# Patient Record
Sex: Male | Born: 1975 | Hispanic: No | Marital: Married | Smoking: Current every day smoker
Health system: Southern US, Community
[De-identification: ages and names within clinical notes are randomized; demographics above are authoritative.]

## PROBLEM LIST (undated history)

## (undated) DIAGNOSIS — A048 Other specified bacterial intestinal infections: Secondary | ICD-10-CM

---

## 2016-05-06 ENCOUNTER — Ambulatory Visit
Admission: RE | Admit: 2016-05-06 | Discharge: 2016-05-06 | Disposition: A | Discharge status: Home / Self Care | Payer: PPO | Source: Ambulatory Visit | Attending: General Surgery | Admitting: General Surgery

## 2016-05-06 ENCOUNTER — Other Ambulatory Visit: Payer: Self-pay | Admitting: General Surgery

## 2016-05-06 DIAGNOSIS — M25562 Pain in left knee: Secondary | ICD-10-CM

## 2016-06-14 ENCOUNTER — Encounter: Payer: PPO | Attending: General Surgery | Admitting: Dietician

## 2016-06-14 DIAGNOSIS — Z713 Dietary counseling and surveillance: Secondary | ICD-10-CM | POA: Insufficient documentation

## 2016-06-14 NOTE — Progress Notes (Signed)
  Pre-Op Assessment Visit:  Pre-Operative Sleeve gastrectomy Surgery  Medical Nutrition Therapy:  Appt start time: 215   End time:  300.  Patient was seen on 06/14/2016 for Pre-Operative Nutrition Assessment. Assessment and letter of approval [facility] coordinator on 06/15/2016.   Preferred Learning Style:   No preference indicated   Learning Readiness:   Ready  Handouts given during visit include:  Pre-Op Goals Bariatric Surgery Protein Shakes   During the appointment today the following Pre-Op Goals were reviewed with the patient: Maintain or lose weight as instructed by your surgeon Make healthy food choices Begin to limit portion sizes Limited concentrated sugars and fried foods Keep fat/sugar in the single digits per serving on   food labels Practice CHEWING your food  (aim for 30 chews per bite or until applesauce consistency) Practice not drinking 15 minutes before, during, and 30 minutes after each meal/snack Avoid all carbonated beverages  Avoid/limit caffeinated beverages  Avoid all sugar-sweetened beverages Consume 3 meals per day; eat every 3-5 hours Make a list of non-food related activities Aim for 64-100 ounces of FLUID daily  Aim for at least 60-80 grams of PROTEIN daily Look for a liquid protein source that contain ?15 g protein and ?5 g carbohydrate  (ex: shakes, drinks, shots)  Demonstrated degree of understanding via:  Teach Back  Teaching Method Utilized:  Visual Auditory Hands on  Barriers to learning/adherence to lifestyle change: none  [facility] to call the [facility] to enroll in Pre-Op and Post-Op Nutrition Education when surgery date is scheduled.

## 2016-07-12 ENCOUNTER — Encounter: Payer: PPO | Attending: General Surgery | Admitting: Registered"

## 2016-07-12 DIAGNOSIS — Z713 Dietary counseling and surveillance: Secondary | ICD-10-CM | POA: Insufficient documentation

## 2016-07-12 DIAGNOSIS — E6609 Other obesity due to excess calories: Secondary | ICD-10-CM

## 2016-07-19 NOTE — Progress Notes (Signed)
Supervised Weight Loss Class:  Appt start time: 1600   End time:  1630.  Patient was seen on 07/12/2016 for the "Mindful Eating" Supervised Weight Loss Class at the [facility]. The following learning objectives were met by the patient during this class:  Surgery type: unkwn Start weight at [facility]: 313.3 lbs on 06/14/16 Weight today: 312.5 lbs Weight change from start: 0.8 weight loss  Objectives:  Discuss masticating foods to appropriate consistency  Helps food fit in pouch and prevents pain and vomiting  Eating slowly and taking tiny bites help brain register fullness  Encourage patients to begin to think about what diet will be like after surgery  Encourage patients to begin to consider portion sizes and listen to hunger/fullness cues  Review importance of regular meals and snacks  Goals:  Begin limiting fried and other high fat foods and foods high in sugar  Eat proteins first, then vegetables, then starch  Practice chewing foods to applesauce consistency  Aim to spend 20 minutes eating meals  Work on eating 3 meals a day (or every 3-5 hours) if you are not already doing so  Handouts given:  Mindful Eating brochure

## 2016-08-05 ENCOUNTER — Encounter: Payer: PPO | Attending: General Surgery | Admitting: Registered"

## 2016-08-05 DIAGNOSIS — Z713 Dietary counseling and surveillance: Secondary | ICD-10-CM | POA: Diagnosis present

## 2016-08-06 NOTE — Progress Notes (Signed)
Supervised Weight Loss Class:  Appt start time: 1400   End time:  1430  SWL visit #2 Patient was seen on 08/05/2016 for the "Exercise" Supervised Weight Loss Class at [facility].   Surgery type: sleeve gastrectomy Start weight at [facility]: 313.3 lb Weight today: 308.8 lb Weight change since start: 4.5 lb wt loss Note for next SWL visit: get accurate height measurement (without shoes) for accurate BMI  The following learning objectives were met by the patient during this class:  Objectives:  Discuss importance of exercise  Helps burn calories  Preserves/builds lean mass  Stress relief  Weight maintenance  Explain BELT program details  Encouraged to engage in moderate physical activity including cardiovascular and weight bearing weekly  Goals: Find an exercise/activity that you enjoy Follow physical activity recommendations stated Follow diet recommendations listed below   Estimated energy needs: 1600-1800 calories  Handouts given: Exercise Brochure

## 2016-09-09 ENCOUNTER — Encounter: Payer: PPO | Attending: General Surgery | Admitting: Registered"

## 2016-09-09 DIAGNOSIS — E669 Obesity, unspecified: Secondary | ICD-10-CM

## 2016-09-09 DIAGNOSIS — Z713 Dietary counseling and surveillance: Secondary | ICD-10-CM | POA: Diagnosis present

## 2016-09-10 NOTE — Progress Notes (Signed)
Supervised Weight Loss Class:  Appt start time: 1600   End time:  1630  Patient was seen on 09/09/2016 for the "Vitamins" Supervised Weight Loss Class at the [facility].   Surgery type: Sleeve gastrectomy Start weight at [facility]: 313.3 Weight today: 313.4 Weight change: 0.1 lb gain  The following learning objectives were met by the patient during this class:  Objectives: -Review vitamin and Calcium recommendations based on procedure -Identify appropriate types/brands of vitamins and Calcium -Reinforce the need for chewable or liquid supplements  Goals: -Find appropriate supplements that meet taste and budget needs -Begin taking a recommended multivitamin and Calcium supplement -Follow physical activity recommendations stated -Follow diet recommendations listed below   Energy and Macronutrient Recomendations: Calories: 1800 Carbohydrate: 200 Protein: 135 Fat: 50  Handouts given: Vitamins Brochure

## 2016-10-05 ENCOUNTER — Ambulatory Visit: Payer: PPO | Admitting: Registered"

## 2016-10-06 ENCOUNTER — Encounter: Payer: PPO | Attending: General Surgery | Admitting: Registered"

## 2016-10-06 DIAGNOSIS — Z713 Dietary counseling and surveillance: Secondary | ICD-10-CM | POA: Diagnosis present

## 2016-10-06 DIAGNOSIS — E669 Obesity, unspecified: Secondary | ICD-10-CM

## 2016-10-06 NOTE — Patient Instructions (Signed)
-   Aim to consume 3 meals a day.  - Find a a variety of liquid protein shake options that you enjoy with 15g or more of protein and 5g or less of carbohydrates.

## 2016-10-06 NOTE — Progress Notes (Signed)
Appt start time: 12:07 end time: 12:25 Assessment: 4th SWL Appointment   Start Wt at [facility]: 313.2 Wt: 313.6 BMI: 41.12   Pt arrives having maintained weight from previous visit. Pt is reducing soda intake and not drinking them every day. Pt states he drinks them every other day. Pt states he will be observing Ramadan starting tomorrow.   Preferred Learning Style:   No preference indicated   Learning Readiness:   Ready  Change in progress  MEDICATIONS: See list   DIETARY INTAKE:  24-hr recall:  B ( AM): bagel w/ cheese, honey Snk ( AM): granola bar  L ( PM): banana Snk ( PM): none D ( PM): rice, chicken or spaghetti or pizza Snk ( PM): apple, orange Beverages: water, diet coke, buttermilk  Usual physical activity: Gym 2-3 times per week, walking 30 min daily  Diet to Follow: 1800 calories 200 g carbohydrates 135 g protein 50 g fat   Nutritional Diagnosis:  [place]-3.3 Overweight/obesity related to past poor dietary habits and physical inactivity as evidenced by patient w/ upcoming sleeve gastrectomy surgery following dietary guidelines for continued weight loss.    Intervention:  Nutrition counseling for upcoming Bariatric Surgery.  Goals: - Aim to consume 3 meals a day. - Find a a variety of liquid protein shake options that you enjoy with 15g or more of protein and 5g or less of carbohydrates.  Teaching Method Utilized:  Visual Auditory Hands on  Handouts given during visit include:  Pre-op Goals  Protein Shakes  Barriers to learning/adherence to lifestyle change: none  Demonstrated degree of understanding via:  Teach Back   Monitoring/Evaluation:  Dietary intake, exercise, and body weight in 1 month(s).

## 2016-10-28 ENCOUNTER — Ambulatory Visit: Payer: PPO | Admitting: Registered"

## 2016-12-13 ENCOUNTER — Encounter: Payer: PPO | Attending: General Surgery | Admitting: Registered"

## 2016-12-13 ENCOUNTER — Encounter: Payer: Self-pay | Admitting: Registered"

## 2016-12-13 DIAGNOSIS — Z713 Dietary counseling and surveillance: Secondary | ICD-10-CM | POA: Insufficient documentation

## 2016-12-13 DIAGNOSIS — E669 Obesity, unspecified: Secondary | ICD-10-CM

## 2016-12-13 NOTE — Progress Notes (Signed)
Appt start time: 4:25 end time: 4:50 Assessment: 6th SWL Appointment   Start Wt at [facility]: 313.2 Wt:  309.6 BMI: 40.59   Pt arrives having lost 4 lbs from previous visit. Pt states he just returned from [place] his [place] in [place], where he completed his 5th SWL visit. 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    Preferred Learning Style:   No preference indicated   Learning Readiness:   Ready  Change in progress  MEDICATIONS: See list   DIETARY INTAKE:  24-hr recall:  B ( AM): pancakes Snk ( AM): none L ( PM): chicken  Snk ( PM): none D ( PM): sometimes skips: fruit or rice, chicken or spaghetti or pizza Snk ( PM): none Beverages: coffee, tea, water, diet coke (1-2x/wk), buttermilk  Usual physical activity: Gym 2-3 times per week, walking 30 min daily  Diet to Follow: 1800 calories 200 g carbohydrates 135 g protein 50 g fat   Nutritional Diagnosis:  [place]-3.3 Overweight/obesity related to past poor dietary habits and physical inactivity as evidenced by patient w/ upcoming sleeve gastrectomy surgery following dietary guidelines for continued weight loss.    Intervention:  Nutrition counseling for upcoming Bariatric Surgery.  Goals: - Find a a variety of liquid protein shake options that you enjoy with 15g or more of protein and 5g or less of carbohydrates. - Aim to eat 3 meals/day.   Teaching Method Utilized:  Visual Auditory Hands on  Handouts given during visit include:  Protein Shakes  Barriers to learning/adherence to lifestyle change: none  Demonstrated degree of understanding via:  Teach Back   Monitoring/Evaluation:  Dietary intake, exercise, and body weight prn.

## 2016-12-13 NOTE — Patient Instructions (Addendum)
-   Find a a variety of liquid protein shake options that you enjoy with 15g or more of protein and 5g or less of carbohydrates.  - Aim to eat 3 meals/day.

## 2017-01-03 ENCOUNTER — Other Ambulatory Visit (HOSPITAL_COMMUNITY): Payer: Self-pay | Admitting: General Surgery

## 2017-01-03 DIAGNOSIS — E669 Obesity, unspecified: Secondary | ICD-10-CM

## 2017-01-13 ENCOUNTER — Ambulatory Visit (HOSPITAL_COMMUNITY): Payer: PPO

## 2017-01-13 ENCOUNTER — Encounter (HOSPITAL_COMMUNITY): Payer: Self-pay

## 2017-01-19 ENCOUNTER — Encounter: Payer: PPO | Attending: General Surgery | Admitting: Registered"

## 2017-01-19 ENCOUNTER — Encounter: Payer: Self-pay | Admitting: Registered"

## 2017-01-19 DIAGNOSIS — E669 Obesity, unspecified: Secondary | ICD-10-CM

## 2017-01-19 DIAGNOSIS — Z713 Dietary counseling and surveillance: Secondary | ICD-10-CM | POA: Insufficient documentation

## 2017-01-19 NOTE — Patient Instructions (Addendum)
-   Try Premier Protein shakes.   - Start to wean off caffeine as surgery date approaches.

## 2017-01-19 NOTE — Progress Notes (Signed)
Appt start time: 10:55 end time: 11:05 Assessment: 6th SWL Appointment   Start Wt at [facility]: 313.2 Wt: 304.6 BMI: 39.98   Pt arrives having lost 5 lbs from previous visit. Pt states he has tried Atkins Protein shakes (vanilla). Pt states this is his last SWL visit with us prior to surgery.    Preferred Learning Style:   No preference indicated   Learning Readiness:   Ready  Change in progress  MEDICATIONS: See list   DIETARY INTAKE:  24-hr recall:  B ( AM): pancakes Snk ( AM): none L ( PM): chicken  Snk ( PM): none D ( PM): sometimes skips: fruit or rice, chicken or spaghetti or pizza Snk ( PM): none Beverages: coffee, tea, water, diet coke (1-2x/wk), buttermilk  Usual physical activity: Gym 2-3 times per week, walking 30 min daily  Diet to Follow: 1800 calories 200 g carbohydrates 135 g protein 50 g fat   Nutritional Diagnosis:  [place]-3.3 Overweight/obesity related to past poor dietary habits and physical inactivity as evidenced by patient w/ upcoming sleeve gastrectomy surgery following dietary guidelines for continued weight loss.    Intervention:  Nutrition counseling for upcoming Bariatric Surgery.  Goals: - Try Premier Protein shakes.  - Start to wean off caffeine as surgery date approaches.    Teaching Method Utilized:  Visual Auditory Hands on  Handouts given during visit include:  none  Barriers to learning/adherence to lifestyle change: none  Demonstrated degree of understanding via:  Teach Back   Monitoring/Evaluation:  Dietary intake, exercise, and body weight prn.

## 2017-01-26 ENCOUNTER — Ambulatory Visit (HOSPITAL_COMMUNITY)
Admission: RE | Admit: 2017-01-26 | Discharge: 2017-01-26 | Disposition: A | Discharge status: Home / Self Care | Payer: PPO | Source: Ambulatory Visit | Attending: General Surgery | Admitting: General Surgery

## 2017-01-26 DIAGNOSIS — E669 Obesity, unspecified: Secondary | ICD-10-CM

## 2017-01-26 DIAGNOSIS — Z6830 Body mass index (BMI) 30.0-30.9, adult: Secondary | ICD-10-CM | POA: Diagnosis not present

## 2017-02-21 ENCOUNTER — Ambulatory Visit: Payer: PPO

## 2017-02-28 ENCOUNTER — Encounter: Payer: PPO | Attending: General Surgery | Admitting: Skilled Nursing Facility1

## 2017-02-28 DIAGNOSIS — E669 Obesity, unspecified: Secondary | ICD-10-CM

## 2017-02-28 DIAGNOSIS — Z713 Dietary counseling and surveillance: Secondary | ICD-10-CM | POA: Diagnosis not present

## 2017-03-01 ENCOUNTER — Encounter: Payer: Self-pay | Admitting: Skilled Nursing Facility1

## 2017-03-01 NOTE — Progress Notes (Signed)
Pre-Operative Nutrition Class:  Appt start time: 1102   End time:  1830.  Patient was seen on 02/28/2017 for Pre-Operative Bariatric Surgery Education at the [facility].   Pt states he has been smoking since he was 41 years old so it has been very difficult to stop.  Surgery date:  Surgery type: Sleeve Start weight at [facility]: 313.2 Weight today: 311.5  Samples given per MNT protocol. Patient educated on appropriate usage: -Try kefir in the yogurt aisle   -Try to add in vegetables throughout the day  The following the learning objectives were met by the patient during this course:  Identify Pre-Op Dietary Goals and will begin 2 weeks pre-operatively  Identify appropriate sources of fluids and proteins   State protein recommendations and appropriate sources pre and post-operatively  Identify Post-Operative Dietary Goals and will follow for 2 weeks post-operatively  Identify appropriate multivitamin and calcium sources  Describe the need for physical activity post-operatively and will follow MD recommendations  State when to call healthcare provider regarding medication questions or post-operative complications  Handouts given during class include:  Pre-Op Bariatric Surgery Diet Handout  Protein Shake Handout  Post-Op Bariatric Surgery Nutrition Handout  BELT Program Information Flyer  Support Group Information Flyer  [facility] Bariatric Supplements Price List  Follow-Up Plan: Patient will follow-up at [facility] 2 weeks post operatively for diet advancement per MD.

## 2017-07-28 ENCOUNTER — Other Ambulatory Visit: Payer: Self-pay

## 2017-07-28 ENCOUNTER — Emergency Department (HOSPITAL_BASED_OUTPATIENT_CLINIC_OR_DEPARTMENT_OTHER): Payer: PPO

## 2017-07-28 ENCOUNTER — Encounter (HOSPITAL_BASED_OUTPATIENT_CLINIC_OR_DEPARTMENT_OTHER): Payer: Self-pay | Admitting: *Deleted

## 2017-07-28 ENCOUNTER — Emergency Department (HOSPITAL_BASED_OUTPATIENT_CLINIC_OR_DEPARTMENT_OTHER)
Admission: EM | Admit: 2017-07-28 | Discharge: 2017-07-28 | Disposition: A | Discharge status: Home / Self Care | Payer: PPO | Attending: Emergency Medicine | Admitting: Emergency Medicine

## 2017-07-28 DIAGNOSIS — Y92009 Unspecified place in unspecified non-institutional (private) residence as the place of occurrence of the external cause: Secondary | ICD-10-CM | POA: Diagnosis not present

## 2017-07-28 DIAGNOSIS — M795 Residual foreign body in soft tissue: Secondary | ICD-10-CM

## 2017-07-28 DIAGNOSIS — Y939 Activity, unspecified: Secondary | ICD-10-CM | POA: Diagnosis not present

## 2017-07-28 DIAGNOSIS — Y999 Unspecified external cause status: Secondary | ICD-10-CM | POA: Insufficient documentation

## 2017-07-28 DIAGNOSIS — W273XXA Contact with needle (sewing), initial encounter: Secondary | ICD-10-CM | POA: Diagnosis not present

## 2017-07-28 DIAGNOSIS — F17210 Nicotine dependence, cigarettes, uncomplicated: Secondary | ICD-10-CM | POA: Insufficient documentation

## 2017-07-28 HISTORY — DX: Other specified bacterial intestinal infections: A04.8

## 2017-07-28 MED ORDER — CEPHALEXIN 500 MG PO CAPS: 500.0000 mg | ORAL_CAPSULE | Freq: Four times a day (QID) | ORAL | 0 refills | Status: AC

## 2017-07-28 MED FILL — CEPHALEXIN 500 MG CAPSULE: 500 | 10 days supply | Qty: 40 | Fill #0

## 2017-07-28 NOTE — ED Triage Notes (Signed)
He stepped on a sewing needle this am and thinks part of it is in the bottom of his right foot.

## 2017-07-28 NOTE — ED Provider Notes (Signed)
[facility] EMERGENCY DEPARTMENT Provider Note   CSN: [id number] Arrival date & time: 07/28/17  1046     History   Chief Complaint Chief Complaint  Patient presents with  . Foreign Body in Skin    HPI Zachary Hillockli S. Lacinda Wilcox is a 42 y.o. male.  The history is provided by the patient and medical records. No language interpreter was used.   Zachary Wilcox is a 42 y.o. male who presents to the Emergency Department complaining of wound to the left foot after stepping on a sewing needle this morning at his home.  Patient reports trying to get the needle out, but the distal tip broke off, leaving the sharp side of the needle in the foot.  No medications taken prior to arrival for symptoms.  Pain worse with ambulation or palpation of the area.  No history of diabetes or immune-compromised state.  Tetanus up-to-date.    Past Medical History:  Diagnosis Date  . H. pylori infection     There are no active problems to display for this patient.   History reviewed. No pertinent surgical history.     Home Medications    Prior to Admission medications   Medication Sig Start Date End Date Taking? Authorizing Provider  cephALEXin (KEFLEX) 500 MG capsule Take 1 capsule (500 mg total) by mouth 4 (four) times daily. 07/28/17   Sasha Rueth, Chase PicketJaime Pilcher, PA-C    Family History No family history on file.  Social History Social History   Tobacco Use  . Smoking status: Current Every Day Smoker  . Smokeless tobacco: Never Used  Substance Use Topics  . Alcohol use: No    Frequency: Never  . Drug use: No     Allergies   Patient has no known allergies.   Review of Systems Review of Systems  Musculoskeletal: Positive for myalgias.  Skin: Positive for wound.  Neurological: Negative for weakness and numbness.     Physical Exam Updated Vital Signs BP (!) 144/89   Pulse (!) 110   Temp 98.2 F (36.8 C) (Oral)   Resp 20   Ht 6' (1.829 m)   Wt (!) 141.1 kg (311 lb)   SpO2  98%   BMI 42.18 kg/m   Physical Exam  Constitutional: He appears well-developed and well-nourished. No distress.  HENT:  Head: Normocephalic and atraumatic.  Neck: Neck supple.  Cardiovascular: Normal rate, regular rhythm and normal heart sounds.  No murmur heard. Pulmonary/Chest: Effort normal and breath sounds normal. No respiratory distress. He has no wheezes. He has no rales.  Musculoskeletal:  Full ROM of the right foot without pain. 2+ DP. Good cap refill to all toes. Sensation intact.  Neurological: He is alert.  Skin: Skin is warm and dry.  Pinpoint puncture wound to the plantar surface of the right foot.  No surrounding erythema.  Tenderness to palpation along puncture wound.  No foreign body palpable.  Nursing note and vitals reviewed.    ED Treatments / Results  Labs (all labs ordered are listed, but only abnormal results are displayed) Labs Reviewed - No data to display  EKG  EKG Interpretation None       Radiology Dg Foot Complete Right  Result Date: 07/28/2017 CLINICAL DATA:  Patient stepped on a needle this morning. EXAM: RIGHT FOOT COMPLETE - 3+ VIEW COMPARISON:  None. FINDINGS: There is no evidence of fracture or dislocation. There is mild osteoarthritis of the first MTP joint. There is a linear metallic foreign body along  the plantar aspect of right foot at the level of the mid-anterior calcaneus. IMPRESSION: 1. Linear metallic foreign body along the plantar aspect of right foot at the level of the mid-anterior calcaneus. Electronically Signed   By: Elige Ko   On: 07/28/2017 11:19    Procedures Procedures (including critical care time)  Medications Ordered in ED Medications - No data to display   Initial Impression / Assessment and Plan / ED Course  I have reviewed the triage vital signs and the nursing notes.  Pertinent labs & imaging results that were available during my care of the patient were reviewed by me and considered in my medical  decision making (see chart for details).    Zachary Wilcox is a 42 y.o. male who presents to ED for wound to the bottom of the right foot.  Patient states that he stepped on a sewing needle barefoot at his home.  He tried to remove the needle, but the distal end broke off, leaving the sharp side lodged in the foot.  Pinpoint entry wound to the foot noted.  Foreign body not palpable on exam.  X-ray shows linear foreign body consistent with needle on plantar aspect of the right foot at the level of mid anterior calcaneus approximately 14.5 mm deep.  Did not feel comfortable attempting foreign body removal given the depth.  Discussed case with orthopedics who recommends placing on antibiotics and having patient follow-up in clinic tomorrow for possible removal.  Discussed plan of care with patient at length who agrees with plan.  Will provide crutches and keep nonweightbearing until he sees Ortho tomorrow.  Reasons to return to ER discussed and all questions answered.  Patient discussed with Dr. Juleen China who agrees with treatment plan.    Final Clinical Impressions(s) / ED Diagnoses   Final diagnoses:  Foreign body (FB) in soft tissue    ED Discharge Orders        Ordered    cephALEXin (KEFLEX) 500 MG capsule  4 times daily     07/28/17 1227       Jaskaran Dauzat, Chase Picket, PA-C 07/28/17 1312    Raeford Razor, MD 07/28/17 1600

## 2017-07-28 NOTE — ED Notes (Signed)
Unable to locate pt  

## 2017-07-28 NOTE — Discharge Instructions (Addendum)
It was my pleasure taking care of you today!  Please call the orthopedic doctor listed today to schedule a follow up appointment tomorrow. It is very important that you schedule this appointment for further care.   Please take all of your antibiotics until finished!  Alternate between Tylenol and ibuprofen as needed for pain.   Return to ER for new or worsening symptoms, any additional concerns.

## 2017-10-25 IMAGING — RF DG UGI W/ KUB
15 of 17 series · 15 of 17 positions shown · non-contrast
Comparison: None.

CLINICAL DATA: Morbid obesity.

EXAM:
UPPER GI SERIES WITH KUB
TECHNIQUE: After obtaining a scout radiograph a routine upper GI series was
performed using thin density barium.
FLUOROSCOPY TIME:  Fluoroscopy Time:  2 minutes 42 seconds
Radiation Exposure Index (if provided by the fluoroscopic device):
87.9 mGy

[Series 1: t abdomen supine · 0.15mm/px · 1 of 1 slices shown]
[im 1/1]
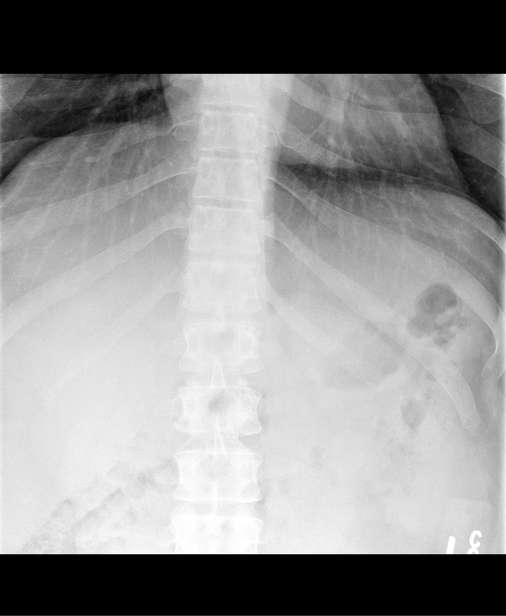

[Series 2: fluoro_barium 2fps_bw · 0.18mm/px · 1 of 1 slices shown (1 of 14)]
[im 1/1]
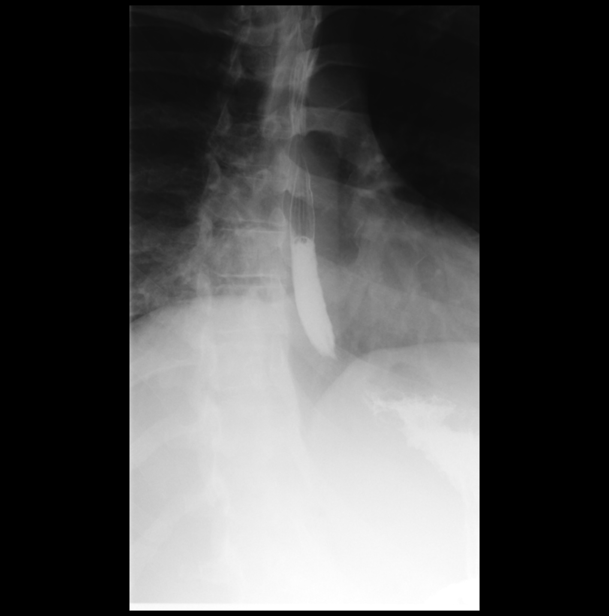

[Series 3: fluoro_barium 2fps_bw · 0.18mm/px · 1 of 1 slices shown (2 of 14)]
[im 1/1]
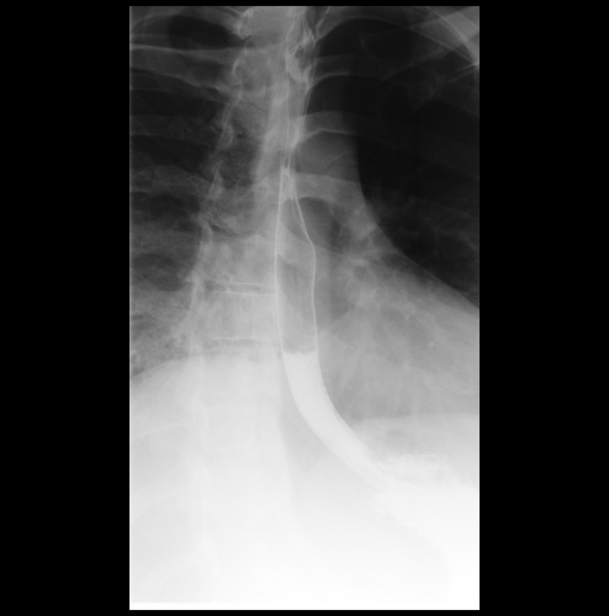

[Series 4: fluoro_barium 2fps_bw · 0.18mm/px · 1 of 1 slices shown (3 of 14)]
[im 1/1]
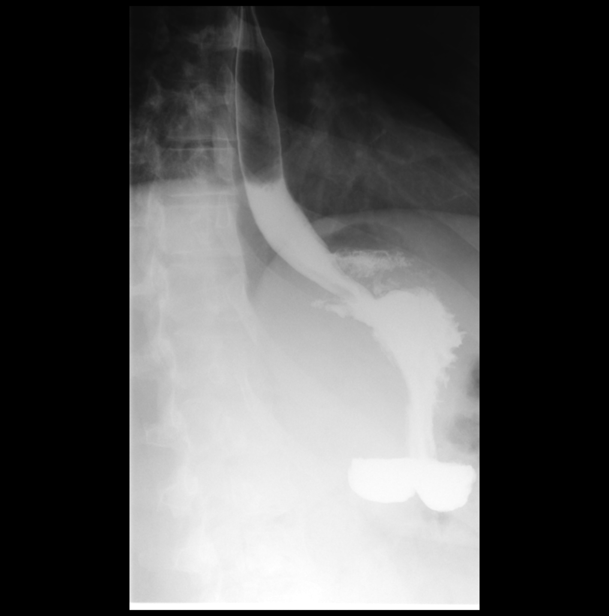

[Series 6: fluoro_barium 2fps_bw · 0.18mm/px · 1 of 1 slices shown (4 of 14)]
[im 1/1]
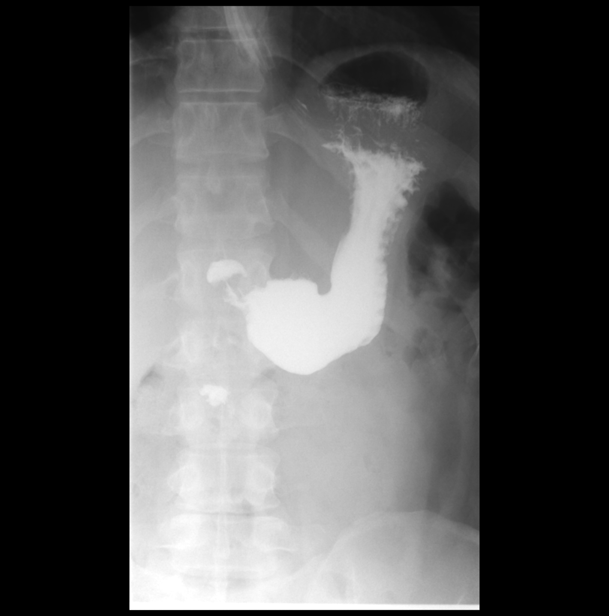

[Series 7: fluoro_barium 2fps_bw · 0.18mm/px · 1 of 1 slices shown (5 of 14)]
[im 1/1]
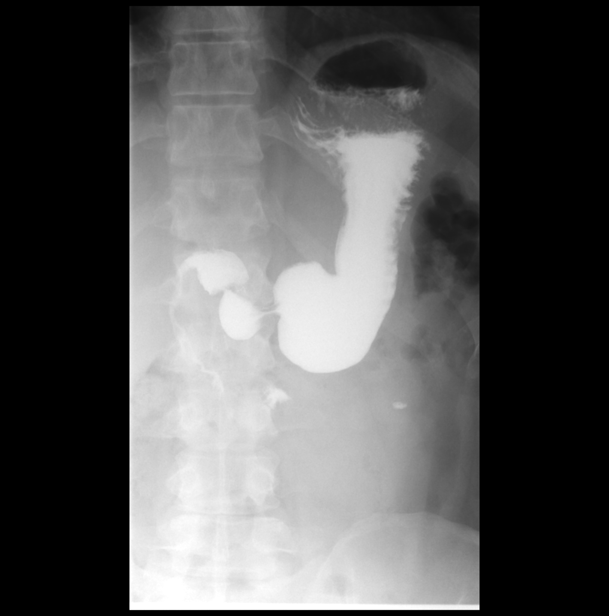

[Series 8: fluoro_barium 2fps_bw · 0.18mm/px · 1 of 1 slices shown (6 of 14)]
[im 1/1]
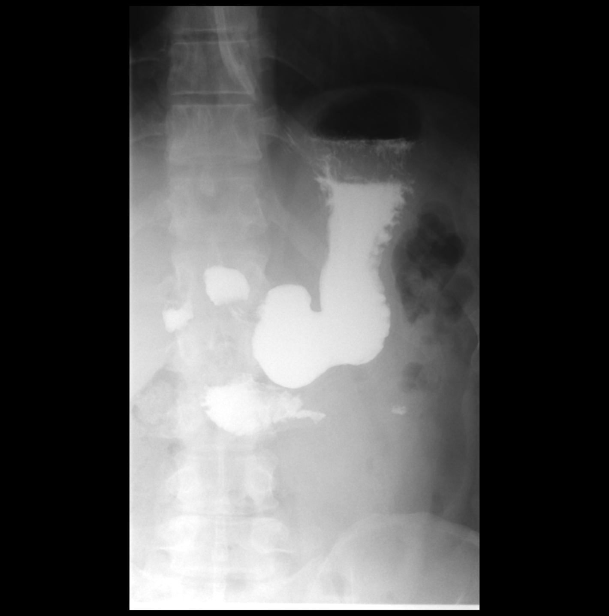

[Series 9: fluoro_barium 2fps_bw · 0.18mm/px · 1 of 1 slices shown (7 of 14)]
[im 1/1]
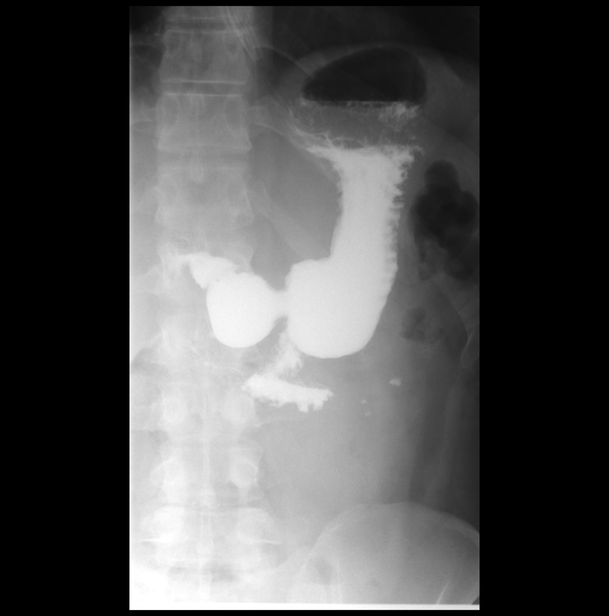

[Series 10: fluoro_barium 2fps_bw · 0.20mm/px · 1 of 1 slices shown (8 of 14)]
[im 1/1]
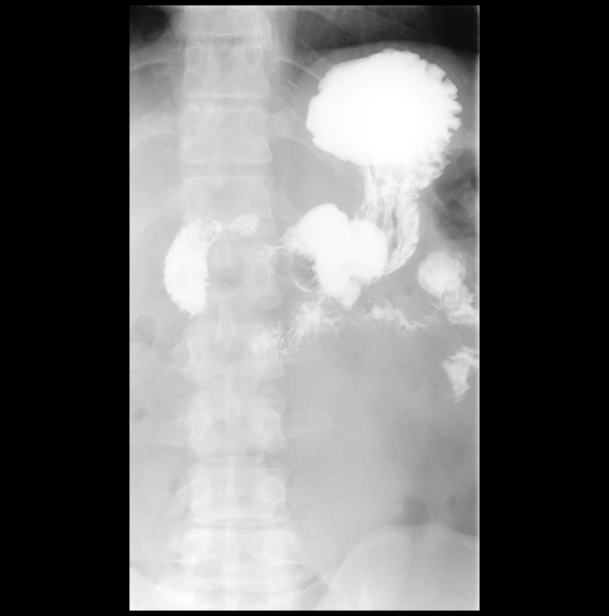

[Series 11: fluoro_barium 2fps_bw · 0.20mm/px · 1 of 1 slices shown (9 of 14)]
[im 1/1]
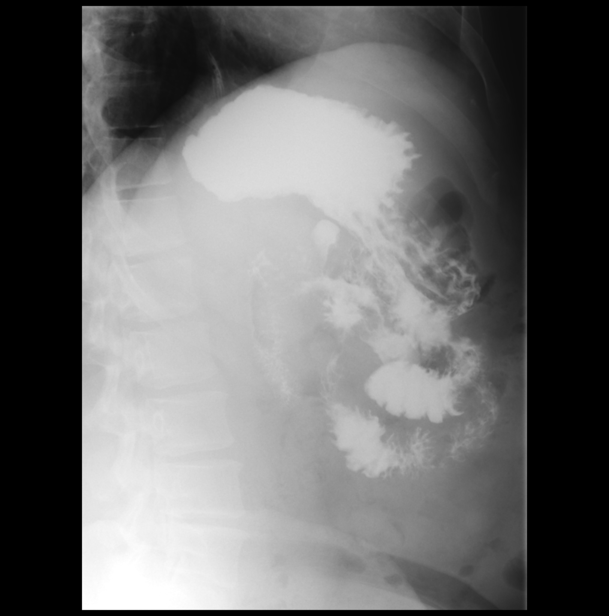

[Series 12: fluoro_barium 2fps_bw · 0.20mm/px · 1 of 1 slices shown (10 of 14)]
[im 1/1]
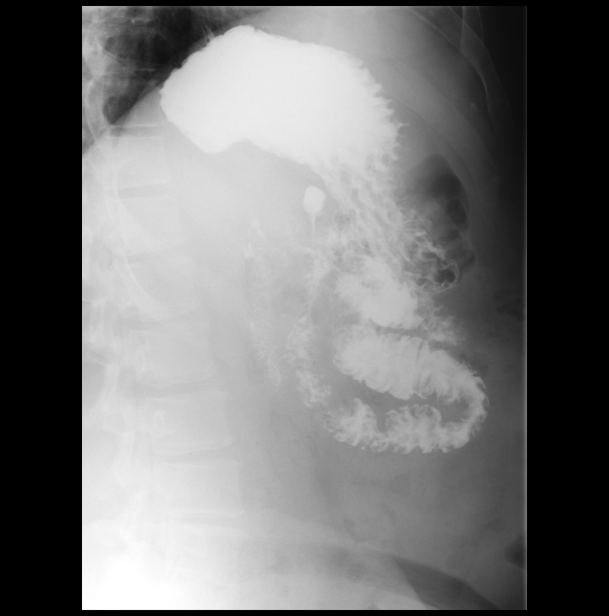

[Series 14: fluoro_barium 2fps_bw · 0.20mm/px · 1 of 1 slices shown (11 of 14)]
[im 1/1]
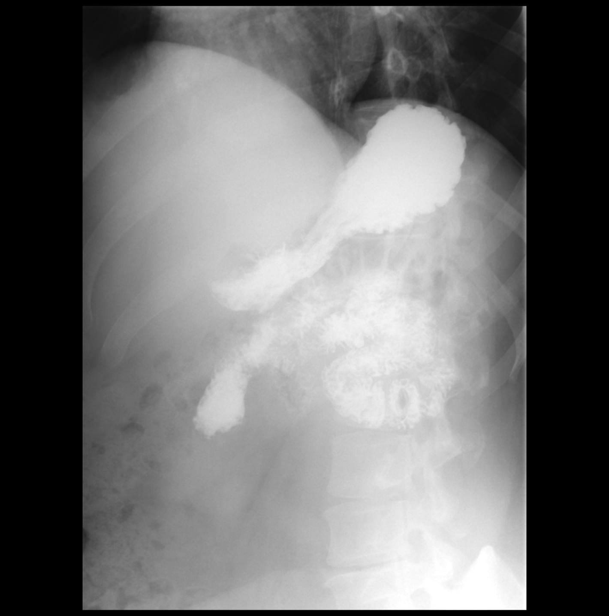

[Series 15: fluoro_barium 2fps_bw · 0.20mm/px · 1 of 1 slices shown (12 of 14)]
[im 1/1]
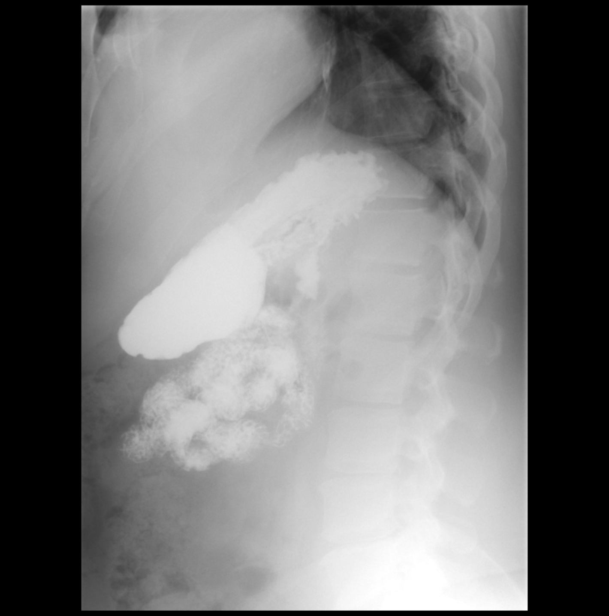

[Series 16: fluoro_barium 2fps_bw · 0.20mm/px · 1 of 1 slices shown (13 of 14)]
[im 1/1]
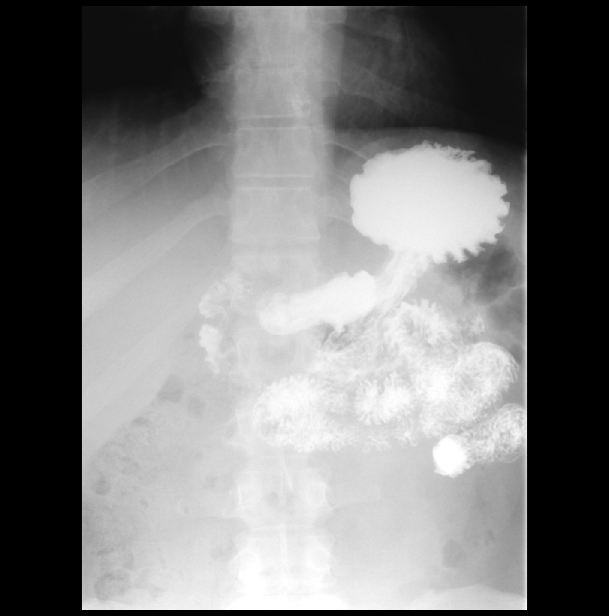

[Series 17: fluoro_barium 2fps_bw · 0.20mm/px · 1 of 1 slices shown (14 of 14)]
[im 1/1]
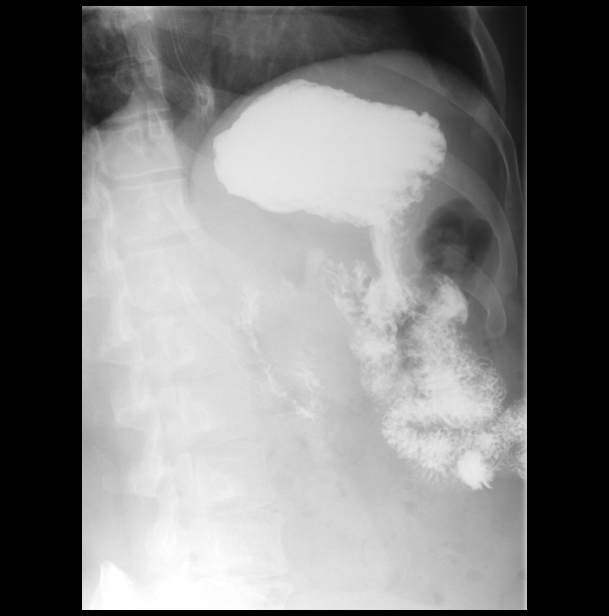

[15 of 17 positions shown; findings below may reference images not displayed]

FINDINGS: The KUB demonstrates a normal bowel gas pattern. The mucosa and
motility of the esophagus are normal. The fundus, body, and antrum
of the stomach appear normal. The pylorus and duodenal bulb and
C-loop are normal.
IMPRESSION: Normal upper GI.

## 2019-01-09 ENCOUNTER — Other Ambulatory Visit: Payer: Self-pay

## 2019-01-09 DIAGNOSIS — Z20822 Contact with and (suspected) exposure to covid-19: Secondary | ICD-10-CM

## 2019-01-10 LAB — NOVEL CORONAVIRUS, NAA: SARS-CoV-2, NAA: NOT DETECTED
